# Patient Record
Sex: Female | Born: 1995
Health system: Southern US, Community
[De-identification: ages and names within clinical notes are randomized; demographics above are authoritative.]

## PROBLEM LIST (undated history)

## (undated) DIAGNOSIS — A599 Trichomoniasis, unspecified: Secondary | ICD-10-CM

## (undated) DIAGNOSIS — F909 Attention-deficit hyperactivity disorder, unspecified type: Secondary | ICD-10-CM

## (undated) DIAGNOSIS — A549 Gonococcal infection, unspecified: Secondary | ICD-10-CM

## (undated) DIAGNOSIS — M419 Scoliosis, unspecified: Secondary | ICD-10-CM

## (undated) HISTORY — DX: Attention-deficit hyperactivity disorder, unspecified type: F90.9

---
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
## 2013-01-02 ENCOUNTER — Ambulatory Visit (INDEPENDENT_AMBULATORY_CARE_PROVIDER_SITE_OTHER): Payer: 59 | Admitting: Obstetrics

## 2013-01-02 ENCOUNTER — Encounter: Payer: Self-pay | Admitting: Obstetrics

## 2013-01-02 ENCOUNTER — Other Ambulatory Visit: Payer: Medicaid Other

## 2013-01-02 ENCOUNTER — Other Ambulatory Visit: Payer: Self-pay | Admitting: Obstetrics

## 2013-01-02 VITALS — BP 125/80 | HR 93 | Temp 98.4°F | Wt 135.0 lb

## 2013-01-02 DIAGNOSIS — A499 Bacterial infection, unspecified: Secondary | ICD-10-CM

## 2013-01-02 DIAGNOSIS — N760 Acute vaginitis: Secondary | ICD-10-CM

## 2013-01-02 DIAGNOSIS — R102 Pelvic and perineal pain: Secondary | ICD-10-CM | POA: Insufficient documentation

## 2013-01-02 DIAGNOSIS — N949 Unspecified condition associated with female genital organs and menstrual cycle: Secondary | ICD-10-CM

## 2013-01-02 DIAGNOSIS — B9689 Other specified bacterial agents as the cause of diseases classified elsewhere: Secondary | ICD-10-CM

## 2013-01-02 MED ORDER — FLUCONAZOLE 150 MG PO TABS: 150.0000 mg | ORAL_TABLET | Freq: Once | ORAL | Status: DC

## 2013-01-02 NOTE — Patient Instructions (Signed)
Pelvic pain

## 2013-01-02 NOTE — Progress Notes (Signed)
Subjective:     Michelle Marquez is a 17 y.o. female here for a routine exam.  Current complaints: in office today for a vaginal dicharge. Pt states she started the metrogel prescribed at her last appointment on 12-28-12. Pt states she had "white clumpy like discharge: Pt states she feels like"something is just not right". Pt states she is experienced some severe lower abdomen pain on Sunday.  Personal health questionnaire reviewed: yes.   Gynecologic History No LMP recorded. Patient has had an injection. Contraception: Depo-Provera injections and OCP (estrogen/progesterone) Last Pap: Never had pap Results were: n/a Last mammogram: n/a Results were: n/a  Obstetric History OB History   Grav Para Term Preterm Abortions TAB SAB Ect Mult Living                   The following portions of the patient's history were reviewed and updated as appropriate: allergies, current medications, past family history, past medical history, past social history, past surgical history and problem list.  Review of Systems Pertinent items are noted in HPI.    Objective:    General appearance: alert and no distress Abdomen: normal findings: soft, non-tender Pelvic: cervix normal in appearance, external genitalia normal, no adnexal masses or tenderness, no cervical motion tenderness, uterus normal size, shape, and consistency and vagina with thin, gray discharge.    Assessment:    BV.  Pelvic Pain.   Plan:    Education reviewed: safe sex/STD prevention. Follow up in: 6 weeks. Tindamax / Diflucan Rx.   U/S ordered.

## 2013-01-03 ENCOUNTER — Ambulatory Visit (INDEPENDENT_AMBULATORY_CARE_PROVIDER_SITE_OTHER): Payer: 59

## 2013-01-03 ENCOUNTER — Other Ambulatory Visit: Payer: Self-pay | Admitting: Obstetrics

## 2013-01-03 DIAGNOSIS — N949 Unspecified condition associated with female genital organs and menstrual cycle: Secondary | ICD-10-CM

## 2013-01-03 DIAGNOSIS — N926 Irregular menstruation, unspecified: Secondary | ICD-10-CM

## 2013-01-03 LAB — WET PREP BY MOLECULAR PROBE
Gardnerella vaginalis: NEGATIVE
Trichomonas vaginosis: NEGATIVE

## 2013-01-03 LAB — GC/CHLAMYDIA PROBE AMP: GC Probe RNA: NEGATIVE

## 2013-01-22 ENCOUNTER — Encounter: Payer: Self-pay | Admitting: Obstetrics & Gynecology

## 2013-02-14 ENCOUNTER — Ambulatory Visit: Payer: Medicaid Other | Admitting: Obstetrics

## 2013-02-14 ENCOUNTER — Encounter: Payer: Self-pay | Admitting: Obstetrics & Gynecology

## 2013-02-14 ENCOUNTER — Ambulatory Visit (INDEPENDENT_AMBULATORY_CARE_PROVIDER_SITE_OTHER): Payer: 59 | Admitting: Obstetrics & Gynecology

## 2013-02-14 VITALS — BP 120/81 | HR 97 | Temp 97.7°F | Ht 61.0 in | Wt 135.2 lb

## 2013-02-14 DIAGNOSIS — Z113 Encounter for screening for infections with a predominantly sexual mode of transmission: Secondary | ICD-10-CM

## 2013-02-14 DIAGNOSIS — Z01419 Encounter for gynecological examination (general) (routine) without abnormal findings: Secondary | ICD-10-CM

## 2013-02-14 DIAGNOSIS — Z3049 Encounter for surveillance of other contraceptives: Secondary | ICD-10-CM

## 2013-02-14 LAB — POCT URINE PREGNANCY: Preg Test, Ur: NEGATIVE

## 2013-02-14 MED ORDER — FLUCONAZOLE 150 MG PO TABS: 150.0000 mg | ORAL_TABLET | Freq: Once | ORAL | Status: DC

## 2013-02-14 MED ORDER — MEDROXYPROGESTERONE ACETATE 150 MG/ML IM SUSP: 150.0000 mg | INTRAMUSCULAR | Status: DC

## 2013-02-14 NOTE — Progress Notes (Signed)
Subjective:     Michelle Marquez is a 17 y.o. female here for a routine exam.  Current complaints: abnormal vaginal bleeding x months.  Personal health questionnaire reviewed: not asked.   Gynecologic History No LMP recorded. Patient has had an injection. Contraception: Depo-Provera injections and OCP (estrogen/progesterone) Last Pap: n/a. Results were: n/a Last mammogram: n/a. Results were: n/a     The following portions of the patient's history were reviewed and updated as appropriate: allergies, current medications, past family history, past medical history, past social history, past surgical history and problem list.  Review of Systems Pertinent items are noted in HPI.    Objective:    General appearance: alert Breasts: normal appearance, no masses or tenderness Abdomen: soft, non-tender; bowel sounds normal; no masses,  no organomegaly Pelvic: cervix normal in appearance, external genitalia normal, no adnexal masses or tenderness, uterus normal size, shape, and consistency and vagina normal without discharge    Assessment:   Unscheduled bleed on Depo provera resolved Plan:   Continue Depo provera Return prn

## 2013-02-14 NOTE — Patient Instructions (Addendum)
Candidal Vulvovaginitis  Candidal vulvovaginitis is an infection of the vagina and vulva. The vulva is the skin around the opening of the vagina. This may cause itching and discomfort in and around the vagina.   HOME CARE  · Only take medicine as told by your doctor.  · Do not have sex (intercourse) until the infection is healed or as told by your doctor.  · Practice safe sex.  · Tell your sex partner about your infection.  · Do not douche or use tampons.  · Wear cotton underwear. Do not wear tight pants or panty hose.  · Eat yogurt. This may help treat and prevent yeast infections.  GET HELP RIGHT AWAY IF:   · You have a fever.  · Your problems get worse during treatment or do not get better in 3 days.  · You have discomfort, irritation, or itching in your vagina or vulva area.  · You have pain after sex.  · You start to get belly (abdominal) pain.  MAKE SURE YOU:  · Understand these instructions.  · Will watch your condition.  · Will get help right away if you are not doing well or get worse.  Document Released: 11/12/2008 Document Revised: 11/08/2011 Document Reviewed: 11/12/2008  ExitCare® Patient Information ©2014 [employer].

## 2013-02-15 LAB — GC/CHLAMYDIA PROBE AMP: CT Probe RNA: NEGATIVE

## 2013-02-15 LAB — WET PREP BY MOLECULAR PROBE: Trichomonas vaginosis: NEGATIVE

## 2013-03-01 ENCOUNTER — Inpatient Hospital Stay (HOSPITAL_COMMUNITY): Payer: Medicaid Other

## 2013-03-01 ENCOUNTER — Encounter (HOSPITAL_COMMUNITY): Payer: Self-pay

## 2013-03-01 ENCOUNTER — Inpatient Hospital Stay (HOSPITAL_COMMUNITY)
Admission: AD | Admit: 2013-03-01 | Discharge: 2013-03-01 | Disposition: A | Discharge status: Home / Self Care | Payer: Medicaid Other | Source: Ambulatory Visit | Attending: Obstetrics | Admitting: Obstetrics

## 2013-03-01 DIAGNOSIS — N949 Unspecified condition associated with female genital organs and menstrual cycle: Secondary | ICD-10-CM | POA: Insufficient documentation

## 2013-03-01 DIAGNOSIS — N921 Excessive and frequent menstruation with irregular cycle: Secondary | ICD-10-CM

## 2013-03-01 DIAGNOSIS — N938 Other specified abnormal uterine and vaginal bleeding: Secondary | ICD-10-CM | POA: Insufficient documentation

## 2013-03-01 LAB — URINALYSIS, ROUTINE W REFLEX MICROSCOPIC
Ketones, ur: NEGATIVE mg/dL
Leukocytes, UA: NEGATIVE
Nitrite: NEGATIVE
pH: 7.5 (ref 5.0–8.0)

## 2013-03-01 LAB — CBC
HCT: 35 % — ABNORMAL LOW (ref 36.0–46.0)
Hemoglobin: 11.7 g/dL — ABNORMAL LOW (ref 12.0–15.0)
MCH: 27.1 pg (ref 26.0–34.0)
MCHC: 33.4 g/dL (ref 30.0–36.0)
MCV: 81.2 fL (ref 78.0–100.0)
Platelets: 308 10*3/uL (ref 150–400)
RBC: 4.31 MIL/uL (ref 3.87–5.11)
RDW: 13.1 % (ref 11.5–15.5)
WBC: 6.5 10*3/uL (ref 4.0–10.5)

## 2013-03-01 LAB — URINE MICROSCOPIC-ADD ON

## 2013-03-01 LAB — POCT PREGNANCY, URINE: Preg Test, Ur: NEGATIVE

## 2013-03-01 MED ORDER — NORETHINDRONE-ETH ESTRADIOL 1-35 MG-MCG PO TABS: 1.0000 | ORAL_TABLET | Freq: Three times a day (TID) | ORAL | Status: DC

## 2013-03-01 MED ORDER — IBUPROFEN 600 MG PO TABS
600.0000 mg | ORAL_TABLET | Freq: Once | ORAL | Status: AC
  Administered 2013-03-01: 600 mg via ORAL
  Filled 2013-03-01: qty 1

## 2013-03-01 NOTE — MAU Provider Note (Signed)
History     CSN: [id number]  Arrival date and time: 03/01/13 1735   First Provider Initiated Contact with Patient 03/01/13 2013      Chief Complaint  Patient presents with  . Vaginal Bleeding   HPI This is a 17 y.o. female who has been on DepoProvera for 3 years  [facility] care at [facility]because [facility] was [facility]"  Now is on her mom's [place] insurance and says they changed the brand of her Depo shot. She has been having breakthrough bleeding for the past 6+ months, lately heavy with cramps. No fever or other symptoms.  Likes the Depo but tired of the bleeding.   RN Note: Patient states she has been on Depo Provera with the last dose in April. States she has had bleeding every day since that time but has been heavy this am and passing clots. Has been having abdominal and back pain today.      [facility] Office Note from May 2014: Subjective:   Michelle Marquez is a 17 y.o. female here for a routine exam. Current complaints: Pt states she has been on the Depo for approximately 3 years and has noticed that within the last few weeks she has had some break through bleeding. Pt states she has been given a rx for some birth control pills to help the bleeding. Pt states the pills help to stop the bleeding. Pt states the bleeding stopped while taking the pills but as soon as she stopped taking the pills she started bleeding again. Pt also has complaints of a vaginal discharge with odor. Personal health questionnaire reviewed: no.   OB History   Grav Para Term Preterm Abortions TAB SAB Ect Mult Living   0 0 0 0 0 0 0 0 0 0       Past Medical History  Diagnosis Date  . Gonorrhea   . Trichomonas   . Scoliosis   . ADHD (attention deficit hyperactivity disorder)   . Scoliosis     Past Surgical History  Procedure Laterality Date  . Spine surgery  2008    for scoliosis    Family History  Problem Relation Age of Onset  . Hypertension Mother   . Asthma  Father     History  Substance Use Topics  . Smoking status: Current Some Day Smoker -- 0.00 packs/day    Types: Cigars    Start date: 12/11/2009  . Smokeless tobacco: Never Used  . Alcohol Use: Yes     Comment: occasional    Allergies: No Known Allergies  Prescriptions prior to admission  Medication Sig Dispense Refill  . medroxyPROGESTERone (DEPO-PROVERA) 150 MG/ML injection Inject 1 mL (150 mg total) into the muscle every 3 (three) months.  1 mL  3  . Prenatal Vit-Fe Fumarate-FA (PNV PRENATAL PLUS MULTIVITAMIN) 27-1 MG TABS Take 1 tablet by mouth daily.  90 tablet  3  . fluconazole (DIFLUCAN) 150 MG tablet Take 1 tablet (150 mg total) by mouth once.  1 tablet  0    Review of Systems  Constitutional: Negative for fever, chills and malaise/fatigue.  Gastrointestinal: Positive for abdominal pain. Negative for nausea, vomiting, diarrhea and constipation.  Genitourinary: Negative for dysuria.  Musculoskeletal: Positive for back pain.  Neurological: Negative for dizziness and headaches.   Physical Exam   Blood pressure 123/89, pulse 101, temperature 98.6 F (37 C), temperature source Oral, resp. rate 16, height 5' 0.5" (1.537 m), weight 62.596 kg (  138 lb).  Physical Exam  Constitutional: She is oriented to person, place, and time. She appears well-developed and well-nourished. No distress.  HENT:  Head: Normocephalic.  Cardiovascular: Normal rate.   Respiratory: Effort normal.  GI: Soft. She exhibits no distension and no mass. There is no tenderness. There is no rebound and no guarding.  Genitourinary: Uterus normal. Vaginal discharge (light bleeding now) found.  Musculoskeletal: Normal range of motion.  Neurological: She is alert and oriented to person, place, and time.  Skin: Skin is warm and dry.  Psychiatric: She has a normal mood and affect.   Results for orders placed during the hospital encounter of 03/01/13 (from the past 24 hour(s))  URINALYSIS, ROUTINE W REFLEX  MICROSCOPIC     Status: Abnormal   Collection Time    03/01/13  6:15 PM      Result Value Range   Color, Urine YELLOW  YELLOW   APPearance CLEAR  CLEAR   Specific Gravity, Urine 1.025  1.005 - 1.030   pH 7.5  5.0 - 8.0   Glucose, UA NEGATIVE  NEGATIVE mg/dL   Hgb urine dipstick TRACE (*) NEGATIVE   Bilirubin Urine NEGATIVE  NEGATIVE   Ketones, ur NEGATIVE  NEGATIVE mg/dL   Protein, ur NEGATIVE  NEGATIVE mg/dL   Urobilinogen, UA 0.2  0.0 - 1.0 mg/dL   Nitrite NEGATIVE  NEGATIVE   Leukocytes, UA NEGATIVE  NEGATIVE  URINE MICROSCOPIC-ADD ON     Status: None   Collection Time    03/01/13  6:15 PM      Result Value Range   Squamous Epithelial / LPF RARE  RARE   WBC, UA 0-2  <3 WBC/hpf   RBC / HPF 0-2  <3 RBC/hpf   Bacteria, UA RARE  RARE  POCT PREGNANCY, URINE     Status: None   Collection Time    03/01/13  6:28 PM      Result Value Range   Preg Test, Ur NEGATIVE  NEGATIVE  CBC     Status: Abnormal   Collection Time    03/01/13  8:05 PM      Result Value Range   WBC 6.5  4.0 - 10.5 K/uL   RBC 4.31  3.87 - 5.11 MIL/uL   Hemoglobin 11.7 (*) 12.0 - 15.0 g/dL   HCT 16.1 (*) 09.6 - 04.5 %   MCV 81.2  78.0 - 100.0 fL   MCH 27.1  26.0 - 34.0 pg   MCHC 33.4  30.0 - 36.0 g/dL   RDW 40.9  81.1 - 91.4 %   Platelets 308  150 - 400 K/uL  US Pelvis Complete  03/01/2013   *RADIOLOGY REPORT*  Clinical Data: Heavy bleeding.  TRANSABDOMINAL AND TRANSVAGINAL ULTRASOUND OF PELVIS Technique:  Both transabdominal and transvaginal ultrasound examinations of the pelvis were performed. Transabdominal technique was performed for global imaging of the pelvis including uterus, ovaries, adnexal regions, and pelvic cul-de-sac.  It was necessary to proceed with endovaginal exam following the transabdominal exam to visualize the endometrium.  Comparison:  None  Findings:  Uterus: Normal in size and appearance  Endometrium: Normal thickness at 0.8 cm.  A small amount of fluid is visualized within the endometrial  canal.  Right ovary:  Normal appearance/no adnexal mass  Left ovary: Normal appearance/no adnexal mass  Other findings: No free fluid  IMPRESSION: No finding to explain the patient's bleeding.  Endometrial stripe thickness is normal.  There is a small amount of fluid within the canal  compatible with hemorrhage.   Original Report Authenticated By: Holley Dexter, M.D.    MAU Course  Procedures  MDM US obtained to rule out polyp or fibroid. Discussed with Dr Clearance Coots. Will try another pack of OCPs to slow bleeding.   Assessment and Plan  A:  Bleeding on DepoProvera       Normal hemoglobin and ultrasound  P:  Rx OrthoNovum 3 pills per day for a week       Followup at [facility] as scheduled  [facility] 03/01/2013, 9:33 PM

## 2013-03-01 NOTE — MAU Note (Signed)
Patient states she has been on Depo Provera with the last dose in April. States she has had bleeding every day since that time but has been heavy this am and passing clots. Has been having abdominal and back pain today.

## 2013-03-07 ENCOUNTER — Ambulatory Visit (INDEPENDENT_AMBULATORY_CARE_PROVIDER_SITE_OTHER): Payer: 59 | Admitting: Obstetrics

## 2013-03-07 ENCOUNTER — Encounter: Payer: Self-pay | Admitting: Obstetrics & Gynecology

## 2013-03-07 ENCOUNTER — Encounter: Payer: Self-pay | Admitting: Obstetrics

## 2013-03-07 VITALS — Ht 60.0 in | Wt 136.4 lb

## 2013-03-07 DIAGNOSIS — N949 Unspecified condition associated with female genital organs and menstrual cycle: Secondary | ICD-10-CM

## 2013-03-07 NOTE — Progress Notes (Signed)
.   Subjective:     Michelle Marquez is a 17 y.o. female here for a problem exam.  Current complaints: Patient has had abnormal bleeding since April.  She has been on depo injections for the last three years.  Personal health questionnaire reviewed: yes.   Gynecologic History No LMP recorded. Patient has had an injection. Contraception: Depo-Provera injections   Obstetric History OB History   Grav Para Term Preterm Abortions TAB SAB Ect Mult Living   0 0 0 0 0 0 0 0 0 0        The following portions of the patient's history were reviewed and updated as appropriate: allergies, current medications, past family history, past medical history, past social history, past surgical history and problem list.  Review of Systems Pertinent items are noted in HPI.    Objective:    No exam performed today, patient left without being seen.    Assessment:    AUB.   Plan:    Patient left before being seen by physician.

## 2013-06-14 ENCOUNTER — Encounter: Payer: Self-pay | Admitting: Advanced Practice Midwife

## 2013-06-18 ENCOUNTER — Ambulatory Visit: Payer: 59 | Admitting: Advanced Practice Midwife

## 2013-06-22 ENCOUNTER — Ambulatory Visit (INDEPENDENT_AMBULATORY_CARE_PROVIDER_SITE_OTHER): Payer: 59 | Admitting: Advanced Practice Midwife

## 2013-06-22 ENCOUNTER — Encounter: Payer: Self-pay | Admitting: Advanced Practice Midwife

## 2013-06-22 VITALS — BP 129/84 | HR 98 | Temp 98.9°F | Ht 61.0 in | Wt 140.0 lb

## 2013-06-22 DIAGNOSIS — A499 Bacterial infection, unspecified: Secondary | ICD-10-CM

## 2013-06-22 DIAGNOSIS — N760 Acute vaginitis: Secondary | ICD-10-CM

## 2013-06-22 DIAGNOSIS — B9689 Other specified bacterial agents as the cause of diseases classified elsewhere: Secondary | ICD-10-CM

## 2013-06-22 MED ORDER — METRONIDAZOLE 0.75 % VA GEL: 1.0000 | Freq: Every day | VAGINAL | Status: DC

## 2013-06-22 NOTE — Progress Notes (Signed)
  Subjective:    Patient ID: Michelle Marquez, female    DOB: Jan 05, 1996, 17 y.o.   MRN: [id number]  Vaginal Discharge The patient's primary symptoms include a vaginal discharge. The patient's pertinent negatives include no pelvic pain. This is a recurrent problem. The current episode started 1 to 4 weeks ago. The problem occurs constantly. The problem has been unchanged. The patient is experiencing no pain. The vaginal discharge was copious, milky, white and malodorous. There has been no bleeding. She has not been passing clots. She has not been passing tissue. She has tried nothing for the symptoms. The treatment provided no relief. She is not sexually active. No, her partner does not have an STD. She uses progestin injections for contraception.   Patient c/o vaginal odor.   Review of Systems  Constitutional: Negative.   HENT: Negative.   Genitourinary: Positive for vaginal discharge. Negative for vaginal bleeding, vaginal pain and pelvic pain.  Skin: Negative.   Allergic/Immunologic: Negative.   Neurological: Negative.   Psychiatric/Behavioral: Negative.        Objective:   Physical Exam  Constitutional: She appears well-developed and well-nourished.  Abdominal: Soft. Bowel sounds are normal.  Genitourinary: Vagina normal and uterus normal.  Wet prep done, negative.          Assessment & Plan:  History of Recurrent BV, not seen w/ wet prep today. Affirm pending Reviewed methods to help prevent recurrent BV. Encouraged healthy diet low in simple carbohydrates and probiotic use.  Patient given flagyl Rx due to being home from college and heading back on Monday. Will have Rx if needed.  Patient to try RepHresh to help w/ odor.   20 min spent with patient greater than 80% spent in counseling and coordination of care.   Chenae Brager Wilson Singer CNM

## 2013-06-22 NOTE — Addendum Note (Signed)
Addended by: Glendell Docker on: 06/22/2013 03:37 PM   Modules accepted: Orders

## 2013-06-23 LAB — WET PREP BY MOLECULAR PROBE: Gardnerella vaginalis: POSITIVE — AB

## 2013-07-05 ENCOUNTER — Other Ambulatory Visit: Payer: Self-pay | Admitting: *Deleted

## 2013-07-05 DIAGNOSIS — B9689 Other specified bacterial agents as the cause of diseases classified elsewhere: Secondary | ICD-10-CM

## 2013-07-05 DIAGNOSIS — Z01419 Encounter for gynecological examination (general) (routine) without abnormal findings: Secondary | ICD-10-CM

## 2013-07-05 MED ORDER — METRONIDAZOLE 500 MG PO TABS: 500.0000 mg | ORAL_TABLET | Freq: Two times a day (BID) | ORAL | Status: DC

## 2013-07-05 MED ORDER — MEDROXYPROGESTERONE ACETATE 150 MG/ML IM SUSP: 150.0000 mg | INTRAMUSCULAR | Status: DC

## 2013-07-05 MED ORDER — METRONIDAZOLE 500 MG PO TABS: 500.0000 mg | ORAL_TABLET | Freq: Two times a day (BID) | ORAL | Status: AC

## 2013-08-18 IMAGING — US US PELVIS COMPLETE
1 series · 14 of 25 positions shown · non-contrast
Comparison: None

CLINICAL DATA: Heavy bleeding.



[Series 1: us pelvis complete · 45 acquisitions, 14 frames shown]
[im 1/45]
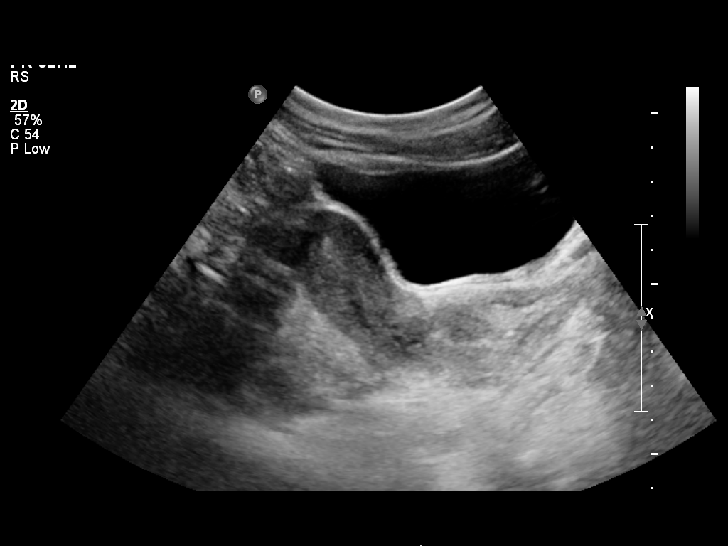
[im 4/45]
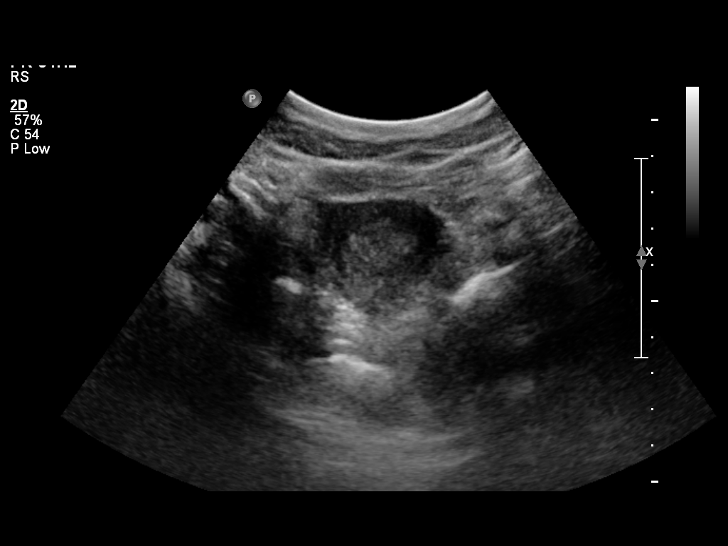
[im 8/45]
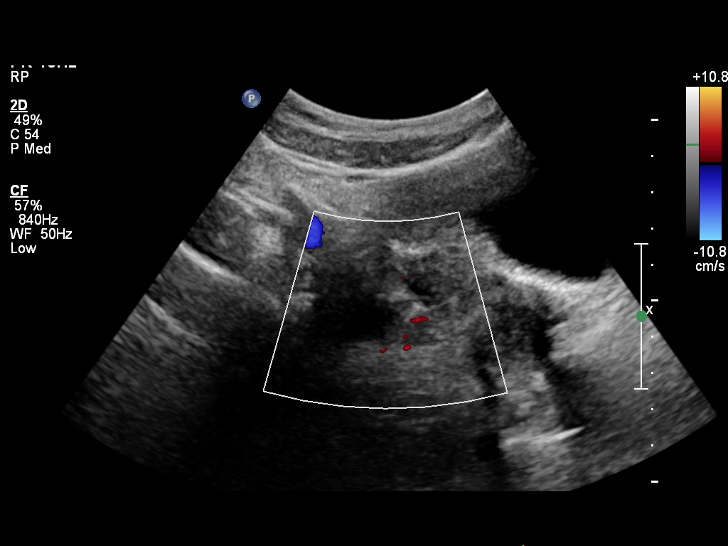
[im 12/45]
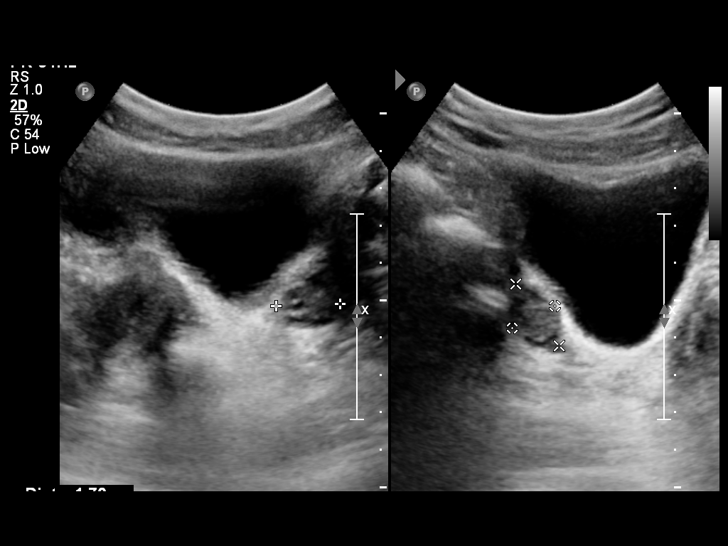
[im 15/45]
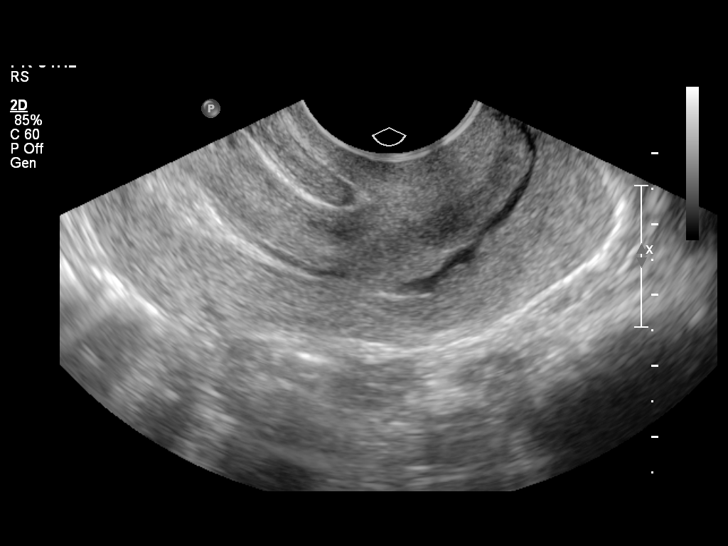
[im 17/45]
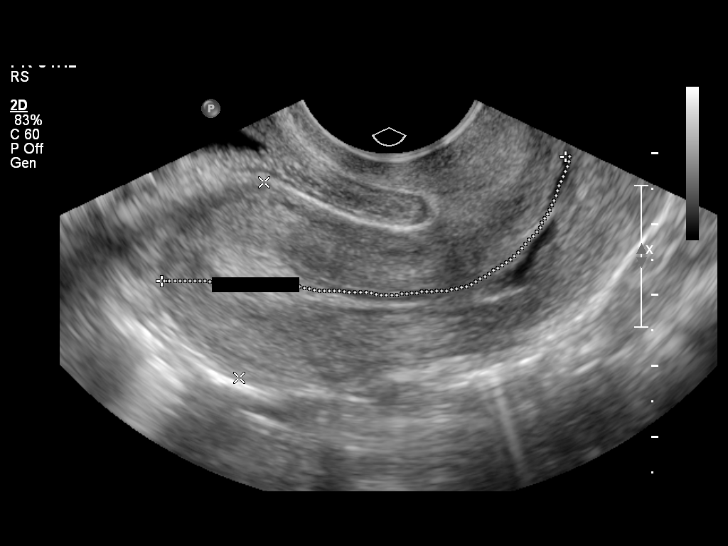
[im 21/45]
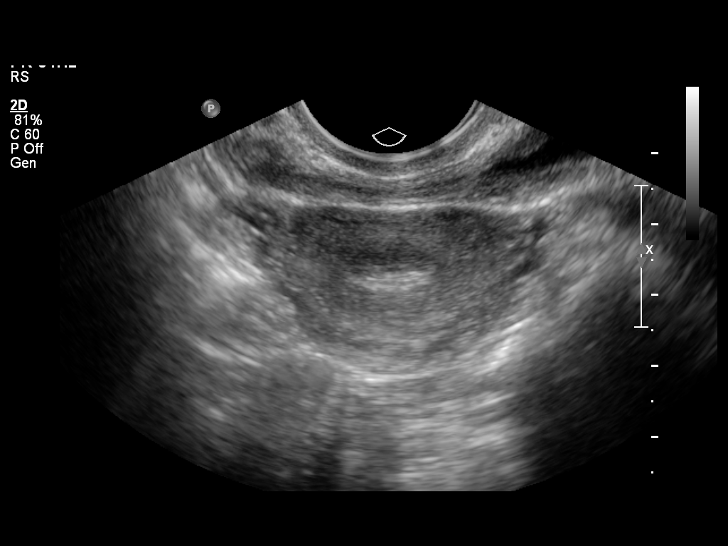
[im 24/45]
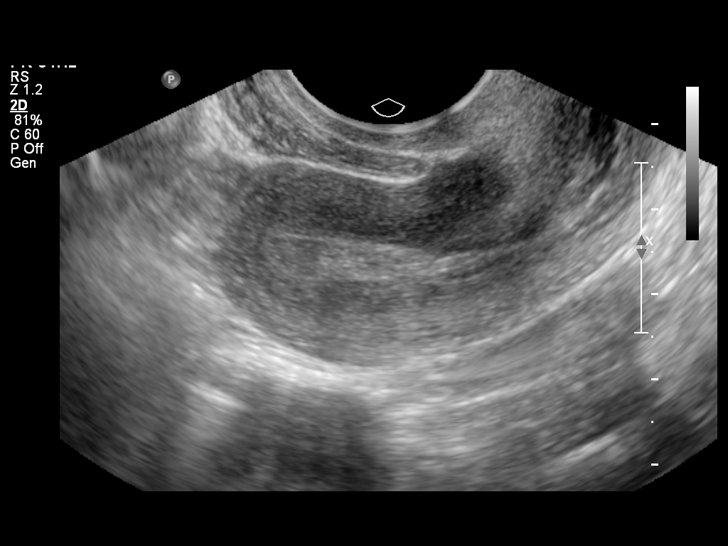
[im 28/45]
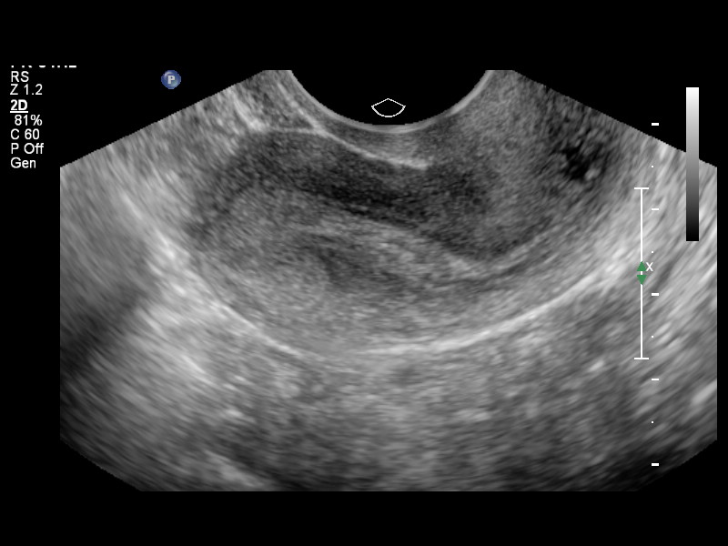
[im 30/45]
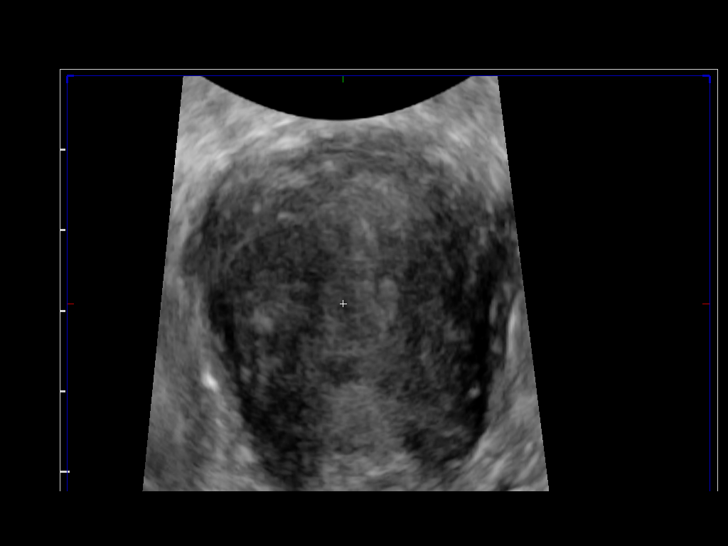
[im 34/45]
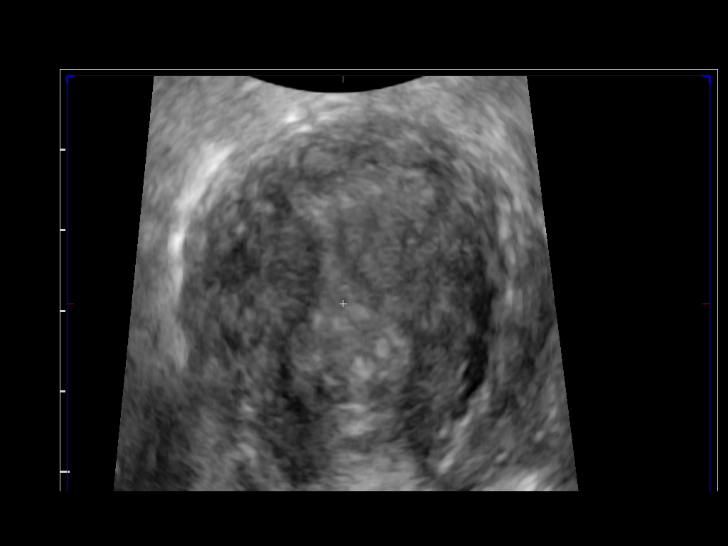
[im 37/45]
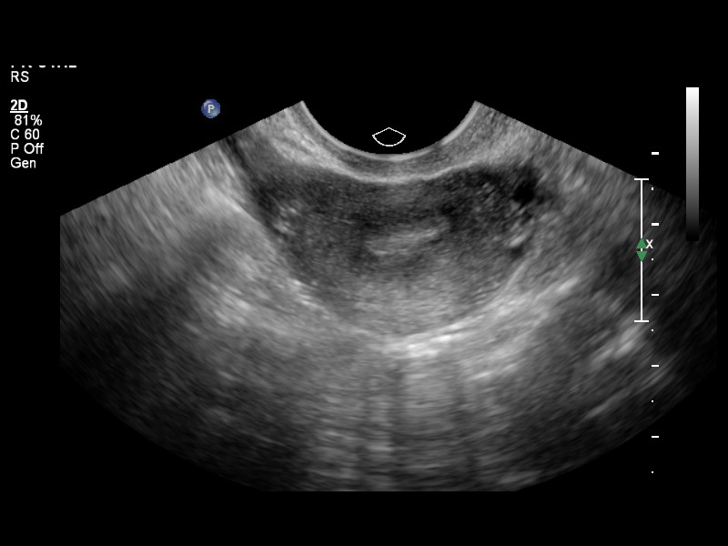
[im 41/45]
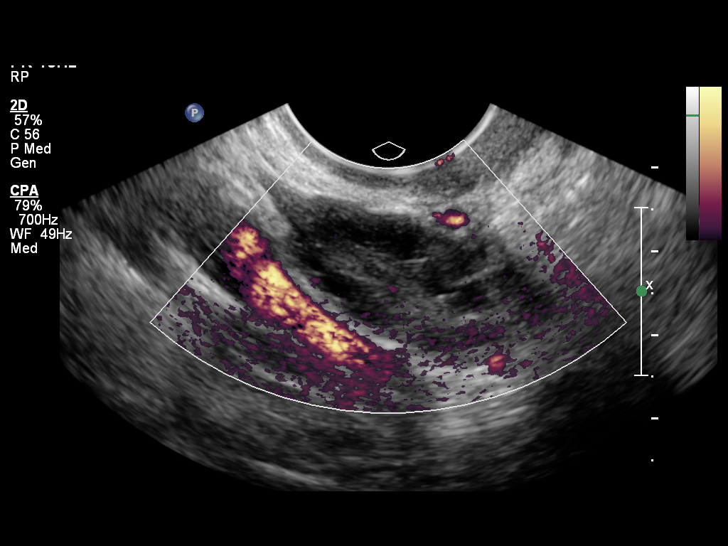
[im 45/45]
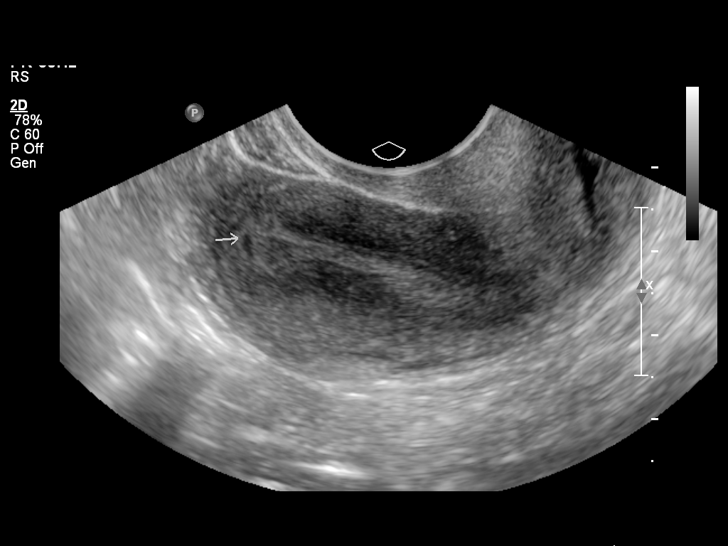

[14 of 25 positions shown; findings below may reference images not displayed]

FINDINGS: Uterus: Normal in size and appearance

Endometrium: Normal thickness at 0.8 cm.  A small amount of fluid
is visualized within the endometrial canal.

Right ovary:  Normal appearance/no adnexal mass

Left ovary: Normal appearance/no adnexal mass

Other findings: No free fluid
IMPRESSION: No finding to explain the patient's bleeding.  Endometrial stripe
thickness is normal.  There is a small amount of fluid within the
canal compatible with hemorrhage..

## 2013-09-20 ENCOUNTER — Other Ambulatory Visit: Payer: Self-pay | Admitting: *Deleted

## 2013-09-20 DIAGNOSIS — B3731 Acute candidiasis of vulva and vagina: Secondary | ICD-10-CM

## 2013-09-20 DIAGNOSIS — B373 Candidiasis of vulva and vagina: Secondary | ICD-10-CM

## 2013-09-20 DIAGNOSIS — B9689 Other specified bacterial agents as the cause of diseases classified elsewhere: Secondary | ICD-10-CM

## 2013-09-20 DIAGNOSIS — N760 Acute vaginitis: Principal | ICD-10-CM

## 2013-09-20 MED ORDER — METRONIDAZOLE 500 MG PO TABS: 500.0000 mg | ORAL_TABLET | Freq: Two times a day (BID) | ORAL | Status: DC

## 2013-09-20 MED ORDER — FLUCONAZOLE 150 MG PO TABS: 150.0000 mg | ORAL_TABLET | ORAL | Status: DC

## 2013-09-21 ENCOUNTER — Encounter: Payer: Self-pay | Admitting: Advanced Practice Midwife

## 2013-12-06 ENCOUNTER — Other Ambulatory Visit: Payer: Self-pay | Admitting: *Deleted

## 2013-12-06 DIAGNOSIS — B3731 Acute candidiasis of vulva and vagina: Secondary | ICD-10-CM

## 2013-12-06 DIAGNOSIS — B373 Candidiasis of vulva and vagina: Secondary | ICD-10-CM

## 2013-12-06 MED ORDER — FLUCONAZOLE 150 MG PO TABS: 150.0000 mg | ORAL_TABLET | ORAL | Status: DC

## 2013-12-07 ENCOUNTER — Encounter: Payer: Self-pay | Admitting: Advanced Practice Midwife

## 2014-02-27 ENCOUNTER — Other Ambulatory Visit: Payer: Self-pay | Admitting: Obstetrics

## 2015-03-24 ENCOUNTER — Telehealth: Payer: Self-pay | Admitting: *Deleted

## 2015-03-24 DIAGNOSIS — B373 Candidiasis of vulva and vagina: Secondary | ICD-10-CM

## 2015-03-24 DIAGNOSIS — B3731 Acute candidiasis of vulva and vagina: Secondary | ICD-10-CM

## 2015-03-24 MED ORDER — FLUCONAZOLE 150 MG PO TABS: 150.0000 mg | ORAL_TABLET | ORAL | Status: DC

## 2015-03-24 NOTE — Telephone Encounter (Signed)
See next note

## 2015-03-24 NOTE — Telephone Encounter (Signed)
Patient is requesting Diflucan. 2:52 Call to patient- bad connection- patient request that she be able to call back.

## 2015-03-24 NOTE — Telephone Encounter (Signed)
Patient called back- she is in school in [place] and she [facility] her Depo injections [facility]. Patient is requesting a Rx for Diflucan. Reminded patient that now she is 19 she will need to start her pap smears annually. She plans to get them here. Told patient we would send her Rx for yeast and she is going to call for her annual appointment.

## 2015-06-12 ENCOUNTER — Other Ambulatory Visit: Payer: Self-pay | Admitting: *Deleted

## 2015-06-12 MED ORDER — PRENATAL 27-1 MG PO TABS: 1.0000 | ORAL_TABLET | Freq: Every day | ORAL | Status: DC

## 2015-11-07 ENCOUNTER — Encounter: Payer: Self-pay | Admitting: Family Medicine

## 2015-11-07 ENCOUNTER — Ambulatory Visit (INDEPENDENT_AMBULATORY_CARE_PROVIDER_SITE_OTHER): Payer: 59 | Admitting: Family Medicine

## 2015-11-07 VITALS — BP 130/87 | HR 95 | Temp 98.5°F | Wt 143.0 lb

## 2015-11-07 DIAGNOSIS — Z3009 Encounter for other general counseling and advice on contraception: Secondary | ICD-10-CM | POA: Diagnosis not present

## 2015-11-07 DIAGNOSIS — N62 Hypertrophy of breast: Secondary | ICD-10-CM

## 2015-11-07 DIAGNOSIS — L309 Dermatitis, unspecified: Secondary | ICD-10-CM

## 2015-11-07 LAB — POCT URINE PREGNANCY: PREG TEST UR: NEGATIVE

## 2015-11-07 MED ORDER — TRIAMCINOLONE ACETONIDE 0.025 % EX CREA: 1.0000 "application " | TOPICAL_CREAM | Freq: Two times a day (BID) | CUTANEOUS | Status: DC | PRN

## 2015-11-07 MED FILL — TRIAMCINOLONE 0.025% CREAM: 0.025 | 20 days supply | Qty: 80 | Fill #0

## 2015-11-07 NOTE — Patient Instructions (Addendum)
See handout on IUD below Sent in eczema medicine. Just use this as needed as it can cause thinning and lightening of the skin. Use lotion every day to keep skin moist Referring to plastic surgeon, someone will call with appointment   See me to talk more about the spasms at your convenience  Be well, Dr. Pollie MeyerMcIntyre   Intrauterine Device Information An intrauterine device (IUD) is inserted into your uterus to prevent pregnancy. There are two types of IUDs available:   Copper IUD--This type of IUD is wrapped in copper wire and is placed inside the uterus. Copper makes the uterus and fallopian tubes produce a fluid that kills sperm. The copper IUD can stay in place for 10 years.  Hormone IUD--This type of IUD contains the hormone progestin (synthetic progesterone). The hormone thickens the cervical mucus and prevents sperm from entering the uterus. It also thins the uterine lining to prevent implantation of a fertilized egg. The hormone can weaken or kill the sperm that get into the uterus. One type of hormone IUD can stay in place for 5 years, and another type can stay in place for 3 years. Your health care provider will make sure you are a good candidate for a contraceptive IUD. Discuss with your health care provider the possible side effects.  ADVANTAGES OF AN INTRAUTERINE DEVICE  IUDs are highly effective, reversible, long acting, and low maintenance.   There are no estrogen-related side effects.   An IUD can be used when breastfeeding.   IUDs are not associated with weight gain.   The copper IUD works immediately after insertion.   The hormone IUD works right away if inserted within 7 days of your period starting. You will need to use a backup method of birth control for 7 days if the hormone IUD is inserted at any other time in your cycle.  The copper IUD does not interfere with your female hormones.   The hormone IUD can make heavy menstrual periods lighter and decrease  cramping.   The hormone IUD can be used for 3 or 5 years.   The copper IUD can be used for 10 years. DISADVANTAGES OF AN INTRAUTERINE DEVICE  The hormone IUD can be associated with irregular bleeding patterns.   The copper IUD can make your menstrual flow heavier and more painful.   You may experience cramping and vaginal bleeding after insertion.    This information is not intended to replace advice given to you by your health care provider. Make sure you discuss any questions you have with your health care provider.   Document Released: 07/20/2004 Document Revised: 04/18/2013 Document Reviewed: 02/04/2013 Elsevier Interactive Patient Education [employer]2016 Elsevier Inc.

## 2015-11-07 NOTE — Progress Notes (Signed)
  HPI:  Patient presents today for a new patient appointment to establish general primary care, also to discuss:  - large breasts: wants breast reduction. Wears 34G bra. In 2008 had back surgery for spinal scoliosis. Has lots of pain in shoulders, neck. Has indentation in shoulder skin from bra straps.   - eczema: has history of eczema, treated in the past with clobetasol. Would like new rx for steroid cream  Note patient also mentioned having "spasms" in her body, encouraged her to schedule separate visit to discuss that because we were not able to get to that problem today.  ROS: See HPI  Past Medical Hx:  -genital herpes - has acyclovir on hand, no outbreaks since initial dx in 2014 -ADHD  Past Surgical Hx:  -back surgery for scoliosis in 2008  Family Hx: updated in Epic  Social Hx: in school for business management at [employer], lives in [place], not sure what next steps are, will graduate in a few months Not on any contraception presently. Was on depo since 2009 but stopped this in January. Might be interested in an IUD, wants info on this.   PHYSICAL EXAM: BP 130/87 mmHg  Pulse 95  Temp(Src) 98.5 F (36.9 C) (Oral)  Wt 143 lb (64.864 kg) Gen: NAD, pleasant, cooperative, well appearing HEENT: normocephalic, atraumatic, moist mucous membranes, No anterior cervical or supraclavicular lymphadenopathy.  Heart: regular rate and rhythm, no murmur Lungs: clear to auscultation bilaterally, normal work of breathing  Abdomen: soft, nontender to palpation, no masses or organomegaly Neuro: grossly nonfocal, speech normal Skin: mild eczematous patch on inner thigh, no other rashes noted Chest: large breasts noted. Indentation in shoulder skin from bra marks Psych: normal range of affect, well groomed, speech normal in rate and volume, normal eye contact   ASSESSMENT/PLAN:  Eczema Encouraged continued use of emollients Advised against long term use of clobetasol, this seems to have  been given to her years ago [facility]. Will rx triamcinolone no acute distress see if this will help maintain control.   Large breasts Desires reduction. Will refer to plastic surgery for further consultation.  Contraception management Urine preg done today as patient sexually active without contraception at present. Test was negative. Discussed different options with patient. She will think about IUD. Handout provided.     FOLLOW UP: Follow up at patient's convenience to discuss "spasms" Referring to plastic surgery  GrenadaBrittany J. Pollie MeyerMcIntyre, MD [facility]

## 2015-11-10 ENCOUNTER — Encounter: Payer: Self-pay | Admitting: Family Medicine

## 2015-11-10 DIAGNOSIS — Z309 Encounter for contraceptive management, unspecified: Secondary | ICD-10-CM | POA: Insufficient documentation

## 2015-11-10 DIAGNOSIS — A6000 Herpesviral infection of urogenital system, unspecified: Secondary | ICD-10-CM | POA: Insufficient documentation

## 2015-11-10 DIAGNOSIS — N62 Hypertrophy of breast: Secondary | ICD-10-CM | POA: Insufficient documentation

## 2015-11-10 DIAGNOSIS — L309 Dermatitis, unspecified: Secondary | ICD-10-CM | POA: Insufficient documentation

## 2015-11-10 NOTE — Assessment & Plan Note (Signed)
Desires reduction. Will refer to plastic surgery for further consultation.

## 2015-11-10 NOTE — Assessment & Plan Note (Signed)
Encouraged continued use of emollients Advised against long term use of clobetasol, this seems to have been given to her years ago by dermatology at [facility]. Will rx triamcinolone no acute distress see if this will help maintain control.

## 2015-11-10 NOTE — Assessment & Plan Note (Signed)
Urine preg done today as patient sexually active without contraception at present. Test was negative. Discussed different options with patient. She will think about IUD. Handout provided.

## 2015-12-15 DIAGNOSIS — N62 Hypertrophy of breast: Secondary | ICD-10-CM | POA: Diagnosis not present

## 2015-12-22 DIAGNOSIS — N898 Other specified noninflammatory disorders of vagina: Secondary | ICD-10-CM | POA: Diagnosis not present

## 2015-12-22 DIAGNOSIS — Z5321 Procedure and treatment not carried out due to patient leaving prior to being seen by health care provider: Secondary | ICD-10-CM | POA: Diagnosis not present

## 2015-12-23 DIAGNOSIS — R102 Pelvic and perineal pain: Secondary | ICD-10-CM | POA: Diagnosis not present

## 2016-01-28 ENCOUNTER — Telehealth: Payer: Self-pay | Admitting: *Deleted

## 2016-01-28 NOTE — Telephone Encounter (Signed)
Patient is calling for BV medication. She has not been in the office since 2014- she needs an appointment for AEX. [facility]

## 2016-02-09 MED FILL — VALACYCLOVIR HCL 500 MG TAB: 500 | 30 days supply | Qty: 30 | Fill #0

## 2016-02-09 MED FILL — TRIAMCINOLONE 0.025% CREAM: 0.025 | 20 days supply | Qty: 80 | Fill #1

## 2016-02-09 MED FILL — ACYCLOVIR 400 MG TABLET: 400 | 30 days supply | Qty: 60 | Fill #0

## 2016-02-10 DIAGNOSIS — R87610 Atypical squamous cells of undetermined significance on cytologic smear of cervix (ASC-US): Secondary | ICD-10-CM | POA: Diagnosis not present

## 2016-02-10 DIAGNOSIS — Z114 Encounter for screening for human immunodeficiency virus [HIV]: Secondary | ICD-10-CM | POA: Diagnosis not present

## 2016-02-10 DIAGNOSIS — Z3009 Encounter for other general counseling and advice on contraception: Secondary | ICD-10-CM | POA: Diagnosis not present

## 2016-02-10 DIAGNOSIS — A6000 Herpesviral infection of urogenital system, unspecified: Secondary | ICD-10-CM | POA: Diagnosis not present

## 2016-02-10 DIAGNOSIS — Z30013 Encounter for initial prescription of injectable contraceptive: Secondary | ICD-10-CM | POA: Diagnosis not present

## 2016-05-27 DIAGNOSIS — R87610 Atypical squamous cells of undetermined significance on cytologic smear of cervix (ASC-US): Secondary | ICD-10-CM | POA: Diagnosis not present

## 2016-05-27 DIAGNOSIS — R87810 Cervical high risk human papillomavirus (HPV) DNA test positive: Secondary | ICD-10-CM | POA: Diagnosis not present

## 2016-08-18 ENCOUNTER — Other Ambulatory Visit: Payer: Self-pay | Admitting: Family Medicine

## 2016-08-18 MED FILL — VALACYCLOVIR HCL 500 MG TAB: 500 | 30 days supply | Qty: 30 | Fill #0

## 2016-08-18 MED FILL — ACYCLOVIR 400 MG TABLET: 400 | 30 days supply | Qty: 60 | Fill #1

## 2016-08-18 MED FILL — TRIAMCINOLONE 0.025% CREAM: 0.025 | 14 days supply | Qty: 80 | Fill #0

## 2016-08-19 NOTE — Progress Notes (Signed)
   [facility] Phone: [phone]   Date of Visit: 08/20/2016   HPI:  Eczema: - has a history of eczema and currently has a flare with lesions currently on left wrist, lower abdomen, right lower back.  - reports she has been using Triamcinolone cream BID for two months in a row for this current flare and symptoms are not improving. Reports she is using emolients as instructed.  - requests clobetasol because triamcinolone is not improving symptoms. Reports that her prior dermatologist provided this. She only uses clobetasol once a day and for 3 days maximum as the medication quickly resolves the lesions. She knows NOT to use on face, neck, or skin folds.   Muscle Cramps:  - near the end of the visit, reports this is a chronic issue (over 5 years) and is intermittent.  - she would have it "all over the body, even my eye will twitch" - it can occur once a day and last for 5 days in a row - reports she is not stressed - reports she is tried ibuprofen, advil tylenol without improvement - reports stretching intensifies it as well as heat/cold - also tried soothing baths without much relief  Insomnia: - near the end of the visit, reports she has difficulty falling asleep even though she is very tired  - sometimes she wakes up early in the morning and is not able to go back to sleep  - reports she may have "sleep anxiety" - reports she does not exercise regularly, has tried to cut down on caffeine but she does not drink near the end of the day. She does watch tv in bed but usually turns it off when it is time to go to bed. Reports she is not particularly stressed. She does go to school in [place].   ROS: See HPI.  PMFSH:  Eczema  PHYSICAL EXAM: BP 122/86 (BP Location: Right Arm, Patient Position: Sitting, Cuff Size: Normal)   Pulse (!) 101   Temp 98.7 F (37.1 C) (Oral)   Ht 5\' 1"  (1.549 m)   Wt 166 lb 6.4 oz (75.5 kg)   SpO2 92%   BMI 31.44 kg/m  GEN:  NAD CV: RRR, no murmurs, rubs, or gallops PULM: CTAB, normal effort SKIN:  warm and well-perfused; eczema lesions lower abdomen, right lower back. Very small lesion on left wrist.  PSYCH: Mood and affect euthymic, normal rate and volume of speech NEURO: Awake, alert, no focal deficits grossly, normal speech   ASSESSMENT/PLAN: Eczema Triamcinolone cream not resolving lesions. Will prescribe Clobetasol. Reiterated the importance of using as little as possible for the fewest number of days and not using on fac, neck or skin folds. Reiterated continuing to moisturize with emolients. Follow up with PCP if symptoms do not resolve.   Need for Tdap vaccination - Tdap vaccine greater than or equal to 7yo IM  Muscle spasm Unclear in etiology. Will obtain basic labs for initial evaluation. We did not have the proper time to fully discuss this.  - CBC - COMPLETE METABOLIC PANEL WITH GFR   Insomnia, unspecified type Discussed the importance of sleep hygiene. Provided information on sleep hygiene. Try Melatonin. Follow up in 1-2 months with PCP.     Palma HolterKanishka G Gunadasa, MD PGY 2 [facility]

## 2016-08-20 ENCOUNTER — Ambulatory Visit (INDEPENDENT_AMBULATORY_CARE_PROVIDER_SITE_OTHER): Payer: 59 | Admitting: Internal Medicine

## 2016-08-20 ENCOUNTER — Encounter: Payer: Self-pay | Admitting: Internal Medicine

## 2016-08-20 VITALS — BP 122/86 | HR 101 | Temp 98.7°F | Ht 61.0 in | Wt 166.4 lb

## 2016-08-20 DIAGNOSIS — M62838 Other muscle spasm: Secondary | ICD-10-CM | POA: Diagnosis not present

## 2016-08-20 DIAGNOSIS — G4700 Insomnia, unspecified: Secondary | ICD-10-CM

## 2016-08-20 DIAGNOSIS — Z23 Encounter for immunization: Secondary | ICD-10-CM | POA: Diagnosis not present

## 2016-08-20 DIAGNOSIS — L309 Dermatitis, unspecified: Secondary | ICD-10-CM | POA: Diagnosis not present

## 2016-08-20 LAB — COMPLETE METABOLIC PANEL WITH GFR
ALBUMIN: 4.1 g/dL (ref 3.6–5.1)
ALK PHOS: 75 U/L (ref 33–115)
ALT: 13 U/L (ref 6–29)
AST: 18 U/L (ref 10–30)
BILIRUBIN TOTAL: 0.2 mg/dL (ref 0.2–1.2)
BUN: 11 mg/dL (ref 7–25)
CO2: 23 mmol/L (ref 20–31)
Calcium: 8.9 mg/dL (ref 8.6–10.2)
Chloride: 108 mmol/L (ref 98–110)
Creat: 0.81 mg/dL (ref 0.50–1.10)
GFR, Est African American: >89 mL/min (ref >=60)
GFR, Est Non African American: >89 mL/min (ref >=60)
GLUCOSE: 85 mg/dL (ref 65–99)
Potassium: 4.3 mmol/L (ref 3.5–5.3)
SODIUM: 140 mmol/L (ref 135–146)
TOTAL PROTEIN: 7.2 g/dL (ref 6.1–8.1)

## 2016-08-20 LAB — CBC
HCT: 39.1 % (ref 35.0–45.0)
HEMOGLOBIN: 12.5 g/dL (ref 11.7–15.5)
MCH: 27.4 pg (ref 27.0–33.0)
MCHC: 32 g/dL (ref 32.0–36.0)
MCV: 85.6 fL (ref 80.0–100.0)
MPV: 9.7 fL (ref 7.5–12.5)
Platelets: 369 10*3/uL (ref 140–400)
RBC: 4.57 MIL/uL (ref 3.80–5.10)
RDW: 13.8 % (ref 11.0–15.0)
WBC: 7.9 10*3/uL (ref 3.8–10.8)

## 2016-08-20 MED ORDER — CLOBETASOL PROPIONATE 0.05 % EX LOTN: TOPICAL_LOTION | CUTANEOUS | 0 refills | Status: DC

## 2016-08-20 MED ORDER — MELATONIN 3 MG PO TABS: 3.0000 mg | ORAL_TABLET | Freq: Every day | ORAL | 0 refills | Status: AC

## 2016-08-20 NOTE — Patient Instructions (Addendum)
We will get some labs to see why you are having muscle spasms.   Please use clobetasol VERY sparingly.   Follow up with Dr. Pollie MeyerMcIntyre in 1-2 months    What can I do to improve my insomnia? - You can follow good "sleep hygiene." That means that you:  ?Sleep only long enough to feel rested and then get out of bed  ?Go to bed and get up at the same time every day  ?Do not try to force yourself to sleep. If you can't sleep, get out of bed and try again later.  ?Have coffee, tea, and other foods that have caffeine only in the morning  ?Avoid alcohol in the late afternoon, evening, and bedtime  ?Avoid smoking, especially in the evening  ?Keep your bedroom dark, cool, quiet, and free of reminders of work or other things that cause you stress  ?Solve problems you have before you go to bed  ?Exercise several days a week, but not right before bed  ?Avoid looking at phones or reading devices ("e-books") that give off light before bed. This can make it harder to fall asleep.  Other things that can improve sleep include:  ?Relaxation therapy, in which you focus on relaxing all the muscles in your body 1 by 1  ?Working with a Conservator, museum/gallerycounselor or psychologist to deal with the problems that might be causing poor sleep

## 2016-08-23 NOTE — Assessment & Plan Note (Signed)
Triamcinolone cream not resolving lesions. Will prescribe Clobetasol. Reiterated the importance of using as little as possible for the fewest number of days and not using on fac, neck or skin folds. Reiterated continuing to moisturize with emolients. Follow up with PCP if symptoms do not resolve.

## 2016-08-25 ENCOUNTER — Telehealth: Payer: Self-pay | Admitting: Internal Medicine

## 2016-08-25 MED ORDER — CLOBETASOL PROPIONATE 0.05 % EX OINT: TOPICAL_OINTMENT | CUTANEOUS | 0 refills | Status: AC

## 2016-08-25 MED FILL — CLOBETASOL 0.05% OINTMENT: 0.05 | 30 days supply | Qty: 60 | Fill #0

## 2016-08-25 NOTE — Telephone Encounter (Signed)
Please call patient to report that her CBC and BMP are within normal limits and there isn't anything in this lab work that could explain her intermittent muscle cramps.   Also received message from her pharmacist requesting clobetasol ointment instead of lotion. I sent in an Rx this morning.   Please have patient make a follow up appointment in about 1 month with her PCP (Dr. Pollie MeyerMcIntyre) to discuss her insomnia and muscle cramps.

## 2016-08-25 NOTE — Telephone Encounter (Signed)
Spoke with patient and she voiced understanding on message and will plan to pick up ointment and make an appt in 1 month. Michelle Marquez,CMA

## 2016-12-10 MED FILL — VALACYCLOVIR HCL 500 MG TAB: 500 | 30 days supply | Qty: 30 | Fill #1

## 2017-03-14 DIAGNOSIS — Z30018 Encounter for initial prescription of other contraceptives: Secondary | ICD-10-CM | POA: Diagnosis not present

## 2017-03-14 DIAGNOSIS — Z3202 Encounter for pregnancy test, result negative: Secondary | ICD-10-CM | POA: Diagnosis not present

## 2017-03-14 DIAGNOSIS — Z3009 Encounter for other general counseling and advice on contraception: Secondary | ICD-10-CM | POA: Diagnosis not present

## 2017-03-14 DIAGNOSIS — R87612 Low grade squamous intraepithelial lesion on cytologic smear of cervix (LGSIL): Secondary | ICD-10-CM | POA: Diagnosis not present

## 2017-03-14 DIAGNOSIS — Z114 Encounter for screening for human immunodeficiency virus [HIV]: Secondary | ICD-10-CM | POA: Diagnosis not present

## 2017-05-06 DIAGNOSIS — H5213 Myopia, bilateral: Secondary | ICD-10-CM | POA: Diagnosis not present

## 2017-05-06 DIAGNOSIS — H52221 Regular astigmatism, right eye: Secondary | ICD-10-CM | POA: Diagnosis not present

## 2017-05-30 DIAGNOSIS — Z113 Encounter for screening for infections with a predominantly sexual mode of transmission: Secondary | ICD-10-CM | POA: Diagnosis not present

## 2017-09-09 DIAGNOSIS — Z30011 Encounter for initial prescription of contraceptive pills: Secondary | ICD-10-CM | POA: Diagnosis not present

## 2017-09-09 DIAGNOSIS — N760 Acute vaginitis: Secondary | ICD-10-CM | POA: Diagnosis not present

## 2017-09-09 DIAGNOSIS — Z3202 Encounter for pregnancy test, result negative: Secondary | ICD-10-CM | POA: Diagnosis not present

## 2017-09-09 DIAGNOSIS — Z3009 Encounter for other general counseling and advice on contraception: Secondary | ICD-10-CM | POA: Diagnosis not present

## 2017-11-16 DIAGNOSIS — L309 Dermatitis, unspecified: Secondary | ICD-10-CM | POA: Diagnosis not present

## 2017-11-16 DIAGNOSIS — B369 Superficial mycosis, unspecified: Secondary | ICD-10-CM | POA: Diagnosis not present

## 2017-11-16 DIAGNOSIS — G4719 Other hypersomnia: Secondary | ICD-10-CM | POA: Diagnosis not present

## 2017-11-16 DIAGNOSIS — F909 Attention-deficit hyperactivity disorder, unspecified type: Secondary | ICD-10-CM | POA: Diagnosis not present

## 2017-11-16 DIAGNOSIS — N760 Acute vaginitis: Secondary | ICD-10-CM | POA: Diagnosis not present

## 2017-11-28 MED FILL — traZODone HCL 50 MG TABS: 50 | 30 days supply | Qty: 30 | Fill #0

## 2017-11-28 MED FILL — FLUCONAZOLE 150 MG TABLET: 150 | 2 days supply | Qty: 2 | Fill #0

## 2017-11-28 MED FILL — VALACYCLOVIR HCL 500 MG TAB: 500 | 30 days supply | Qty: 30 | Fill #0

## 2017-11-30 MED FILL — CLOTRIMAZOLE 1% SOLUTION: 1 | 10 days supply | Qty: 10 | Fill #0

## 2017-12-05 ENCOUNTER — Other Ambulatory Visit: Payer: Self-pay | Admitting: Internal Medicine

## 2017-12-05 MED FILL — CLOBETASOL 0.05% CREAM: 0.05 | 15 days supply | Qty: 60 | Fill #0

## 2017-12-05 MED FILL — ADDERALL XR 20 MG CAP SA: 20 | 30 days supply | Qty: 30 | Fill #0

## 2018-02-14 DIAGNOSIS — R252 Cramp and spasm: Secondary | ICD-10-CM | POA: Diagnosis not present

## 2018-02-14 DIAGNOSIS — L84 Corns and callosities: Secondary | ICD-10-CM | POA: Diagnosis not present

## 2018-02-14 DIAGNOSIS — F909 Attention-deficit hyperactivity disorder, unspecified type: Secondary | ICD-10-CM | POA: Diagnosis not present

## 2018-02-15 MED FILL — CYCLOBENZAPRINE 10 MG TAB: 10 | 20 days supply | Qty: 60 | Fill #0

## 2018-02-17 MED FILL — ADDERALL XR 20 MG CAP SA: 20 | 30 days supply | Qty: 30 | Fill #0

## 2018-02-17 MED FILL — VALACYCLOVIR HCL 500 MG TAB: 500 | 30 days supply | Qty: 30 | Fill #1

## 2018-02-24 DIAGNOSIS — R6889 Other general symptoms and signs: Secondary | ICD-10-CM | POA: Diagnosis not present

## 2018-02-24 DIAGNOSIS — R454 Irritability and anger: Secondary | ICD-10-CM | POA: Diagnosis not present

## 2018-02-24 DIAGNOSIS — G4719 Other hypersomnia: Secondary | ICD-10-CM | POA: Diagnosis not present

## 2018-02-24 DIAGNOSIS — R41840 Attention and concentration deficit: Secondary | ICD-10-CM | POA: Diagnosis not present

## 2018-02-24 DIAGNOSIS — G478 Other sleep disorders: Secondary | ICD-10-CM | POA: Diagnosis not present

## 2018-02-24 DIAGNOSIS — F515 Nightmare disorder: Secondary | ICD-10-CM | POA: Diagnosis not present

## 2018-02-24 DIAGNOSIS — R413 Other amnesia: Secondary | ICD-10-CM | POA: Diagnosis not present

## 2018-02-24 DIAGNOSIS — F513 Sleepwalking [somnambulism]: Secondary | ICD-10-CM | POA: Diagnosis not present

## 2018-02-24 DIAGNOSIS — R400 Somnolence: Secondary | ICD-10-CM | POA: Diagnosis not present

## 2018-03-22 DIAGNOSIS — Z114 Encounter for screening for human immunodeficiency virus [HIV]: Secondary | ICD-10-CM | POA: Diagnosis not present

## 2018-03-22 DIAGNOSIS — Z3009 Encounter for other general counseling and advice on contraception: Secondary | ICD-10-CM | POA: Diagnosis not present

## 2018-03-22 DIAGNOSIS — B009 Herpesviral infection, unspecified: Secondary | ICD-10-CM | POA: Diagnosis not present

## 2018-03-22 DIAGNOSIS — Z3202 Encounter for pregnancy test, result negative: Secondary | ICD-10-CM | POA: Diagnosis not present

## 2018-04-11 MED FILL — VALACYCLOVIR HCL 500 MG TAB: 500 | 30 days supply | Qty: 30 | Fill #2

## 2018-04-12 MED FILL — SHIPPING COST: 1 days supply | Qty: 1 | Fill #0

## 2018-04-12 MED FILL — ADDERALL XR 20 MG CAP SA: 20 | 30 days supply | Qty: 30 | Fill #0

## 2018-04-27 DIAGNOSIS — A5900 Urogenital trichomoniasis, unspecified: Secondary | ICD-10-CM | POA: Diagnosis not present

## 2018-07-11 DIAGNOSIS — Z114 Encounter for screening for human immunodeficiency virus [HIV]: Secondary | ICD-10-CM | POA: Diagnosis not present

## 2018-07-11 DIAGNOSIS — Z113 Encounter for screening for infections with a predominantly sexual mode of transmission: Secondary | ICD-10-CM | POA: Diagnosis not present

## 2018-07-11 DIAGNOSIS — N899 Noninflammatory disorder of vagina, unspecified: Secondary | ICD-10-CM | POA: Diagnosis not present

## 2018-07-18 DIAGNOSIS — F909 Attention-deficit hyperactivity disorder, unspecified type: Secondary | ICD-10-CM | POA: Diagnosis not present

## 2018-07-21 MED FILL — ADDERALL XR 20 MG CAP SA: 20 | 30 days supply | Qty: 30 | Fill #0

## 2018-08-14 DIAGNOSIS — R87612 Low grade squamous intraepithelial lesion on cytologic smear of cervix (LGSIL): Secondary | ICD-10-CM | POA: Diagnosis not present

## 2018-08-25 MED FILL — SHIPPING COST: 1 days supply | Qty: 1 | Fill #1

## 2018-08-25 MED FILL — CYCLOBENZAPRINE HCL 10 MG T: 10 | 20 days supply | Qty: 60 | Fill #1

## 2018-08-25 MED FILL — VALACYCLOVIR HCL 500 MG TAB: 500 | 90 days supply | Qty: 90 | Fill #3

## 2018-08-28 MED FILL — ADDERALL XR 20 MG CAP SA: 20 | 30 days supply | Qty: 30 | Fill #0

## 2018-10-02 DIAGNOSIS — Z113 Encounter for screening for infections with a predominantly sexual mode of transmission: Secondary | ICD-10-CM | POA: Diagnosis not present

## 2018-10-02 DIAGNOSIS — Z3009 Encounter for other general counseling and advice on contraception: Secondary | ICD-10-CM | POA: Diagnosis not present

## 2018-10-02 DIAGNOSIS — Z114 Encounter for screening for human immunodeficiency virus [HIV]: Secondary | ICD-10-CM | POA: Diagnosis not present

## 2018-10-02 DIAGNOSIS — N760 Acute vaginitis: Secondary | ICD-10-CM | POA: Diagnosis not present

## 2018-10-16 MED FILL — ADDERALL XR 20 MG CAP SA: 20 | 30 days supply | Qty: 30 | Fill #0

## 2018-11-07 DIAGNOSIS — N760 Acute vaginitis: Secondary | ICD-10-CM | POA: Diagnosis not present

## 2018-11-07 DIAGNOSIS — Z3009 Encounter for other general counseling and advice on contraception: Secondary | ICD-10-CM | POA: Diagnosis not present

## 2018-11-07 DIAGNOSIS — Z113 Encounter for screening for infections with a predominantly sexual mode of transmission: Secondary | ICD-10-CM | POA: Diagnosis not present

## 2018-11-16 MED FILL — ADDERALL XR 20 MG CAP SA: 20 | 30 days supply | Qty: 30 | Fill #0

## 2018-11-16 MED FILL — VALACYCLOVIR HCL 500 MG TAB: 500 | 90 days supply | Qty: 90 | Fill #4

## 2018-11-23 DIAGNOSIS — S2096XA Insect bite (nonvenomous) of unspecified parts of thorax, initial encounter: Secondary | ICD-10-CM | POA: Diagnosis not present

## 2018-11-23 DIAGNOSIS — W57XXXA Bitten or stung by nonvenomous insect and other nonvenomous arthropods, initial encounter: Secondary | ICD-10-CM | POA: Diagnosis not present

## 2018-12-13 DIAGNOSIS — F909 Attention-deficit hyperactivity disorder, unspecified type: Secondary | ICD-10-CM | POA: Diagnosis not present

## 2018-12-13 MED FILL — ADDERALL XR 30 MG CAP SA: 30 | 30 days supply | Qty: 30 | Fill #0

## 2018-12-18 DIAGNOSIS — Z113 Encounter for screening for infections with a predominantly sexual mode of transmission: Secondary | ICD-10-CM | POA: Diagnosis not present

## 2018-12-18 DIAGNOSIS — N898 Other specified noninflammatory disorders of vagina: Secondary | ICD-10-CM | POA: Diagnosis not present

## 2018-12-18 DIAGNOSIS — Z3009 Encounter for other general counseling and advice on contraception: Secondary | ICD-10-CM | POA: Diagnosis not present

## 2019-01-13 DIAGNOSIS — H5213 Myopia, bilateral: Secondary | ICD-10-CM | POA: Diagnosis not present

## 2019-01-13 DIAGNOSIS — H52223 Regular astigmatism, bilateral: Secondary | ICD-10-CM | POA: Diagnosis not present

## 2019-01-18 MED FILL — ADDERALL XR 30 MG CAP SA: 30 | 30 days supply | Qty: 30 | Fill #0

## 2019-02-08 DIAGNOSIS — Z113 Encounter for screening for infections with a predominantly sexual mode of transmission: Secondary | ICD-10-CM | POA: Diagnosis not present

## 2019-02-08 DIAGNOSIS — Z114 Encounter for screening for human immunodeficiency virus [HIV]: Secondary | ICD-10-CM | POA: Diagnosis not present

## 2019-02-28 MED FILL — ADDERALL XR 30 MG CAP SA: 30 | 30 days supply | Qty: 30 | Fill #0

## 2019-03-01 MED FILL — VALACYCLOVIR HCL 500 MG TAB: 500 | 30 days supply | Qty: 30 | Fill #0

## 2019-03-17 DIAGNOSIS — Z139 Encounter for screening, unspecified: Secondary | ICD-10-CM | POA: Diagnosis not present

## 2019-03-17 DIAGNOSIS — N898 Other specified noninflammatory disorders of vagina: Secondary | ICD-10-CM | POA: Diagnosis not present

## 2019-03-17 DIAGNOSIS — N760 Acute vaginitis: Secondary | ICD-10-CM | POA: Diagnosis not present

## 2019-03-17 DIAGNOSIS — E559 Vitamin D deficiency, unspecified: Secondary | ICD-10-CM | POA: Diagnosis not present

## 2019-03-20 DIAGNOSIS — N760 Acute vaginitis: Secondary | ICD-10-CM | POA: Diagnosis not present

## 2019-03-20 DIAGNOSIS — N898 Other specified noninflammatory disorders of vagina: Secondary | ICD-10-CM | POA: Diagnosis not present

## 2019-03-26 DIAGNOSIS — N898 Other specified noninflammatory disorders of vagina: Secondary | ICD-10-CM | POA: Diagnosis not present

## 2019-03-26 DIAGNOSIS — N760 Acute vaginitis: Secondary | ICD-10-CM | POA: Diagnosis not present

## 2019-03-26 DIAGNOSIS — Z139 Encounter for screening, unspecified: Secondary | ICD-10-CM | POA: Diagnosis not present

## 2019-04-04 MED FILL — VALACYCLOVIR HCL 500 MG TAB: 500 | 90 days supply | Qty: 90 | Fill #1

## 2019-04-05 MED FILL — ADDERALL XR 30 MG CAP SA: 30 | 30 days supply | Qty: 30 | Fill #0

## 2019-04-09 DIAGNOSIS — Z113 Encounter for screening for infections with a predominantly sexual mode of transmission: Secondary | ICD-10-CM | POA: Diagnosis not present

## 2019-04-09 DIAGNOSIS — Z3009 Encounter for other general counseling and advice on contraception: Secondary | ICD-10-CM | POA: Diagnosis not present

## 2019-04-09 DIAGNOSIS — N72 Inflammatory disease of cervix uteri: Secondary | ICD-10-CM | POA: Diagnosis not present

## 2019-05-25 MED FILL — ADDERALL XR 30 MG CAP SA: 30 | 30 days supply | Qty: 30 | Fill #0

## 2019-06-08 DIAGNOSIS — Z01419 Encounter for gynecological examination (general) (routine) without abnormal findings: Secondary | ICD-10-CM | POA: Diagnosis not present

## 2019-06-08 DIAGNOSIS — Z202 Contact with and (suspected) exposure to infections with a predominantly sexual mode of transmission: Secondary | ICD-10-CM | POA: Diagnosis not present

## 2019-06-08 DIAGNOSIS — Z3009 Encounter for other general counseling and advice on contraception: Secondary | ICD-10-CM | POA: Diagnosis not present

## 2019-06-08 DIAGNOSIS — Z8742 Personal history of other diseases of the female genital tract: Secondary | ICD-10-CM | POA: Diagnosis not present

## 2019-06-08 DIAGNOSIS — Z8619 Personal history of other infectious and parasitic diseases: Secondary | ICD-10-CM | POA: Diagnosis not present

## 2019-06-08 DIAGNOSIS — R87610 Atypical squamous cells of undetermined significance on cytologic smear of cervix (ASC-US): Secondary | ICD-10-CM | POA: Diagnosis not present

## 2019-06-23 MED FILL — ADDERALL XR 30 MG CAP SA: 30 | 30 days supply | Qty: 30 | Fill #0

## 2019-06-30 DIAGNOSIS — F909 Attention-deficit hyperactivity disorder, unspecified type: Secondary | ICD-10-CM | POA: Diagnosis not present

## 2019-07-02 MED FILL — VALACYCLOVIR HCL 500 MG TAB: 500 | 90 days supply | Qty: 90 | Fill #2

## 2019-08-03 DIAGNOSIS — Z8742 Personal history of other diseases of the female genital tract: Secondary | ICD-10-CM | POA: Diagnosis not present

## 2019-08-03 DIAGNOSIS — Z3202 Encounter for pregnancy test, result negative: Secondary | ICD-10-CM | POA: Diagnosis not present

## 2019-08-03 DIAGNOSIS — N879 Dysplasia of cervix uteri, unspecified: Secondary | ICD-10-CM | POA: Diagnosis not present

## 2019-08-03 DIAGNOSIS — R87810 Cervical high risk human papillomavirus (HPV) DNA test positive: Secondary | ICD-10-CM | POA: Diagnosis not present

## 2019-08-03 DIAGNOSIS — R87610 Atypical squamous cells of undetermined significance on cytologic smear of cervix (ASC-US): Secondary | ICD-10-CM | POA: Diagnosis not present

## 2019-08-17 DIAGNOSIS — F909 Attention-deficit hyperactivity disorder, unspecified type: Secondary | ICD-10-CM | POA: Diagnosis not present

## 2019-08-17 MED FILL — ADDERALL XR 30 MG CAP SA: 30 | 30 days supply | Qty: 30 | Fill #0

## 2019-09-19 MED FILL — ADDERALL XR 30 MG CAP SA: 30 | 30 days supply | Qty: 30 | Fill #0

## 2019-09-26 DIAGNOSIS — Z20822 Contact with and (suspected) exposure to covid-19: Secondary | ICD-10-CM | POA: Diagnosis not present

## 2019-10-01 MED FILL — VALACYCLOVIR HCL 500 MG TAB: 500 | 30 days supply | Qty: 30 | Fill #3

## 2019-10-22 MED FILL — ADDERALL XR 30 MG CAP SA: 30 | 30 days supply | Qty: 30 | Fill #0

## 2019-10-23 MED FILL — VALACYCLOVIR HCL 500 MG TAB: 500 | 30 days supply | Qty: 30 | Fill #4

## 2019-11-20 MED FILL — VALACYCLOVIR HCL 500 MG TAB: 500 | 30 days supply | Qty: 30 | Fill #5

## 2019-11-21 MED FILL — ADDERALL XR 30 MG CAP SA: 30 | 30 days supply | Qty: 30 | Fill #0

## 2019-12-18 MED FILL — CYCLOBENZAPRINE HCL 10 MG T: 10 | 20 days supply | Qty: 60 | Fill #0

## 2019-12-18 MED FILL — VALACYCLOVIR HCL 500 MG TAB: 500 | 30 days supply | Qty: 30 | Fill #6

## 2019-12-24 MED FILL — ADDERALL XR 30 MG CAP SA: 30 | 30 days supply | Qty: 30 | Fill #0

## 2020-01-21 MED FILL — VALACYCLOVIR HCL 500 MG TAB: 500 | 30 days supply | Qty: 30 | Fill #7

## 2020-01-21 MED FILL — ADDERALL XR 30 MG CAP SA: 30 | 30 days supply | Qty: 30 | Fill #0

## 2020-01-31 MED FILL — ALBUTEROL SULFATE HFA 108 (: 108 (90 BAS | 25 days supply | Qty: 9 | Fill #0

## 2020-02-19 ENCOUNTER — Other Ambulatory Visit (HOSPITAL_COMMUNITY): Payer: Self-pay

## 2020-02-19 MED FILL — VALACYCLOVIR HCL 500 MG TAB: 500 | 30 days supply | Qty: 30 | Fill #0

## 2020-02-19 MED FILL — ADDERALL XR 30 MG CAP SA: 30 | 30 days supply | Qty: 30 | Fill #0

## 2020-03-19 MED FILL — VALACYCLOVIR HCL 500 MG TAB: 500 | 30 days supply | Qty: 30 | Fill #1

## 2020-03-19 MED FILL — ADDERALL XR 30 MG CAP SA: 30 | 30 days supply | Qty: 30 | Fill #0

## 2020-03-21 ENCOUNTER — Other Ambulatory Visit (HOSPITAL_COMMUNITY): Payer: Self-pay

## 2020-06-17 MED FILL — VALACYCLOVIR HCL 500 MG TAB: 500 | 30 days supply | Qty: 30 | Fill #2

## 2020-07-16 MED FILL — VALACYCLOVIR HCL 500 MG TAB: 500 | 30 days supply | Qty: 30 | Fill #0

## 2022-11-25 ENCOUNTER — Other Ambulatory Visit (HOSPITAL_COMMUNITY): Payer: Self-pay
